# Patient Record
Sex: Female | Born: 1995
Health system: Southern US, Community
[De-identification: ages and names within clinical notes are randomized; demographics above are authoritative.]

## PROBLEM LIST (undated history)

## (undated) DIAGNOSIS — J45909 Unspecified asthma, uncomplicated: Secondary | ICD-10-CM

## (undated) DIAGNOSIS — T7840XA Allergy, unspecified, initial encounter: Secondary | ICD-10-CM

## (undated) HISTORY — DX: Unspecified asthma, uncomplicated: J45.909

## (undated) HISTORY — DX: Allergy, unspecified, initial encounter: T78.40XA

## (undated) HISTORY — PX: EAR TUBE REMOVAL: SHX1486

---
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
## 2017-02-02 DIAGNOSIS — J4540 Moderate persistent asthma, uncomplicated: Secondary | ICD-10-CM | POA: Diagnosis not present

## 2017-02-02 DIAGNOSIS — J3089 Other allergic rhinitis: Secondary | ICD-10-CM | POA: Diagnosis not present

## 2017-02-02 DIAGNOSIS — J3081 Allergic rhinitis due to animal (cat) (dog) hair and dander: Secondary | ICD-10-CM | POA: Diagnosis not present

## 2017-07-20 DIAGNOSIS — J3081 Allergic rhinitis due to animal (cat) (dog) hair and dander: Secondary | ICD-10-CM | POA: Diagnosis not present

## 2017-07-20 DIAGNOSIS — J4540 Moderate persistent asthma, uncomplicated: Secondary | ICD-10-CM | POA: Diagnosis not present

## 2017-11-30 DIAGNOSIS — Z23 Encounter for immunization: Secondary | ICD-10-CM | POA: Diagnosis not present

## 2018-01-10 DIAGNOSIS — Z1331 Encounter for screening for depression: Secondary | ICD-10-CM | POA: Diagnosis not present

## 2018-01-10 DIAGNOSIS — R21 Rash and other nonspecific skin eruption: Secondary | ICD-10-CM | POA: Diagnosis not present

## 2018-01-10 DIAGNOSIS — J4520 Mild intermittent asthma, uncomplicated: Secondary | ICD-10-CM | POA: Diagnosis not present

## 2018-01-10 DIAGNOSIS — J301 Allergic rhinitis due to pollen: Secondary | ICD-10-CM | POA: Diagnosis not present

## 2018-01-10 DIAGNOSIS — Z1389 Encounter for screening for other disorder: Secondary | ICD-10-CM | POA: Diagnosis not present

## 2018-01-10 DIAGNOSIS — J4521 Mild intermittent asthma with (acute) exacerbation: Secondary | ICD-10-CM | POA: Diagnosis not present

## 2018-02-19 DIAGNOSIS — J019 Acute sinusitis, unspecified: Secondary | ICD-10-CM | POA: Diagnosis not present

## 2018-02-19 DIAGNOSIS — J4520 Mild intermittent asthma, uncomplicated: Secondary | ICD-10-CM | POA: Diagnosis not present

## 2018-03-12 DIAGNOSIS — J069 Acute upper respiratory infection, unspecified: Secondary | ICD-10-CM | POA: Diagnosis not present

## 2018-03-12 DIAGNOSIS — J3081 Allergic rhinitis due to animal (cat) (dog) hair and dander: Secondary | ICD-10-CM | POA: Diagnosis not present

## 2018-03-12 DIAGNOSIS — J4540 Moderate persistent asthma, uncomplicated: Secondary | ICD-10-CM | POA: Diagnosis not present

## 2018-09-10 DIAGNOSIS — J4540 Moderate persistent asthma, uncomplicated: Secondary | ICD-10-CM | POA: Diagnosis not present

## 2019-01-31 DIAGNOSIS — J4540 Moderate persistent asthma, uncomplicated: Secondary | ICD-10-CM | POA: Diagnosis not present

## 2019-01-31 DIAGNOSIS — J3081 Allergic rhinitis due to animal (cat) (dog) hair and dander: Secondary | ICD-10-CM | POA: Diagnosis not present

## 2019-03-25 DIAGNOSIS — Z20828 Contact with and (suspected) exposure to other viral communicable diseases: Secondary | ICD-10-CM | POA: Diagnosis not present

## 2019-07-03 DIAGNOSIS — Z6824 Body mass index (BMI) 24.0-24.9, adult: Secondary | ICD-10-CM | POA: Diagnosis not present

## 2019-07-03 DIAGNOSIS — L259 Unspecified contact dermatitis, unspecified cause: Secondary | ICD-10-CM | POA: Diagnosis not present

## 2019-09-20 DIAGNOSIS — Z1331 Encounter for screening for depression: Secondary | ICD-10-CM | POA: Diagnosis not present

## 2019-09-20 DIAGNOSIS — Z6825 Body mass index (BMI) 25.0-25.9, adult: Secondary | ICD-10-CM | POA: Diagnosis not present

## 2019-09-20 DIAGNOSIS — J4520 Mild intermittent asthma, uncomplicated: Secondary | ICD-10-CM | POA: Diagnosis not present

## 2019-09-20 DIAGNOSIS — G43909 Migraine, unspecified, not intractable, without status migrainosus: Secondary | ICD-10-CM | POA: Diagnosis not present

## 2019-09-20 DIAGNOSIS — Z1389 Encounter for screening for other disorder: Secondary | ICD-10-CM | POA: Diagnosis not present

## 2019-10-01 DIAGNOSIS — Z23 Encounter for immunization: Secondary | ICD-10-CM | POA: Diagnosis not present

## 2019-12-02 DIAGNOSIS — Z23 Encounter for immunization: Secondary | ICD-10-CM | POA: Diagnosis not present
